# Patient Record
Sex: Male | Born: 1990 | Race: White | Hispanic: No | Marital: Single | Smoking: Current every day smoker
Health system: Southern US, Community
[De-identification: ages and names within clinical notes are randomized; demographics above are authoritative.]

---
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
## 2012-07-05 ENCOUNTER — Emergency Department (HOSPITAL_COMMUNITY)
Admission: EM | Admit: 2012-07-05 | Discharge: 2012-07-05 | Disposition: A | Discharge status: Home / Self Care | Payer: Self-pay | Attending: Emergency Medicine | Admitting: Emergency Medicine

## 2012-07-05 ENCOUNTER — Encounter (HOSPITAL_COMMUNITY): Payer: Self-pay | Admitting: Emergency Medicine

## 2012-07-05 DIAGNOSIS — R110 Nausea: Secondary | ICD-10-CM | POA: Insufficient documentation

## 2012-07-05 DIAGNOSIS — R109 Unspecified abdominal pain: Secondary | ICD-10-CM | POA: Insufficient documentation

## 2012-07-05 DIAGNOSIS — F17200 Nicotine dependence, unspecified, uncomplicated: Secondary | ICD-10-CM | POA: Insufficient documentation

## 2012-07-05 DIAGNOSIS — J45909 Unspecified asthma, uncomplicated: Secondary | ICD-10-CM | POA: Insufficient documentation

## 2012-07-05 LAB — CBC WITH DIFFERENTIAL/PLATELET
Basophils Relative: 0 % (ref 0–1)
Eosinophils Absolute: 0.4 10*3/uL (ref 0.0–0.7)
Hemoglobin: 15.2 g/dL (ref 13.0–17.0)
MCH: 30 pg (ref 26.0–34.0)
MCHC: 34.2 g/dL (ref 30.0–36.0)
Monocytes Relative: 11 % (ref 3–12)
Neutrophils Relative %: 68 % (ref 43–77)

## 2012-07-05 LAB — URINALYSIS, ROUTINE W REFLEX MICROSCOPIC
Bilirubin Urine: NEGATIVE
Glucose, UA: NEGATIVE mg/dL
Hgb urine dipstick: NEGATIVE
Ketones, ur: NEGATIVE mg/dL
Leukocytes, UA: NEGATIVE
Nitrite: NEGATIVE
Protein, ur: NEGATIVE mg/dL
Specific Gravity, Urine: 1.025 (ref 1.005–1.030)
Urobilinogen, UA: 0.2 mg/dL (ref 0.0–1.0)
pH: 6 (ref 5.0–8.0)

## 2012-07-05 LAB — LIPASE, BLOOD: Lipase: 10 U/L — ABNORMAL LOW (ref 11–59)

## 2012-07-05 LAB — COMPREHENSIVE METABOLIC PANEL
Albumin: 4 g/dL (ref 3.5–5.2)
Alkaline Phosphatase: 67 U/L (ref 39–117)
BUN: 8 mg/dL (ref 6–23)
Creatinine, Ser: 0.93 mg/dL (ref 0.50–1.35)
Potassium: 4.3 mEq/L (ref 3.5–5.1)
Total Protein: 7 g/dL (ref 6.0–8.3)

## 2012-07-05 MED ORDER — OXYCODONE-ACETAMINOPHEN 5-325 MG PO TABS
1.0000 | ORAL_TABLET | Freq: Once | ORAL | Status: AC
  Administered 2012-07-05: 1 via ORAL
  Filled 2012-07-05: qty 1

## 2012-07-05 MED ORDER — ONDANSETRON 8 MG PO TBDP
8.0000 mg | ORAL_TABLET | Freq: Once | ORAL | Status: AC
  Administered 2012-07-05: 8 mg via ORAL

## 2012-07-05 MED ORDER — ONDANSETRON 8 MG PO TBDP
ORAL_TABLET | ORAL | Status: AC
  Administered 2012-07-05: 8 mg via ORAL
  Filled 2012-07-05: qty 1

## 2012-07-05 MED ORDER — ONDANSETRON 8 MG PO TBDP: 8.0000 mg | ORAL_TABLET | Freq: Three times a day (TID) | ORAL | Status: DC | PRN

## 2012-07-05 NOTE — ED Notes (Signed)
Pt tolerating liquids, states he feels hungry.  States he still has some nausea and lower abdominal pain.  States he does feel okay to go home.  Follow up thoroughly discussed with patient and family who agree to follow up for new or worsening symptoms.

## 2012-07-05 NOTE — ED Notes (Signed)
Patient states now his nausea and pain are resolving.  States it is much better now than earlier today.

## 2012-07-05 NOTE — ED Notes (Signed)
States he started having right sided abdominal pain yesterday, states he had nausea but no vomiting.

## 2012-07-05 NOTE — ED Notes (Signed)
States that his pain began approx. 12 hours ago.  States initially it was 10/10, however now is 6/10.  States he did have some nausea around midnight that continues presently.

## 2012-07-05 NOTE — ED Provider Notes (Signed)
History     CSN: [id number]  Arrival date & time 07/05/12  0134   First MD Initiated Contact with Patient 07/05/12 0141      Chief Complaint  Patient presents with  . Abdominal Pain     Patient is a 21 y.o. male presenting with abdominal pain. The history is provided by the patient.  Abdominal Pain The primary symptoms of the illness include abdominal pain and nausea. The primary symptoms of the illness do not include vomiting, diarrhea or dysuria. The current episode started 1 to 2 hours ago. The onset of the illness was sudden. The problem has been rapidly improving.  Symptoms associated with the illness do not include chills.  PT reports he started to have abdominal pain while sleeping.  Reports it woke him up from sleep.  He reports it was right mid abdomen in location.   It does not radiate.  No dysuria or groin pain reported.  No back or flank pain.  He reports it is improving.  He reports he may have similar episode last night, but none before that.  He has no h/o kidney stones The patient told the nurse he has not had a BM in two days, but no vomiting is reported Past Medical History  Diagnosis Date  . Asthma     History reviewed. No pertinent past surgical history.  No family history on file.  History  Substance Use Topics  . Smoking status: Current Every Day Smoker -- 0.5 packs/day  . Smokeless tobacco: Current User    Types: Chew  . Alcohol Use: No      Review of Systems  Constitutional: Negative for chills.  Gastrointestinal: Positive for nausea and abdominal pain. Negative for vomiting and diarrhea.  Genitourinary: Negative for dysuria.  All other systems reviewed and are negative.    Allergies  Review of patient's allergies indicates no known allergies.  Home Medications  No current outpatient prescriptions on file.  BP 156/84  Pulse 106  Temp 97.7 F (36.5 C) (Oral)  Resp 20  Ht 5\' 10"  (1.778 m)  Wt 200 lb (90.719 kg)  BMI 28.70 kg/m2  SpO2  98% BP 135/84  Pulse 73  Temp 97.7 F (36.5 C) (Oral)  Resp 16  Ht 5\' 10"  (1.778 m)  Wt 200 lb (90.719 kg)  BMI 28.70 kg/m2  SpO2 98%   Physical Exam CONSTITUTIONAL: Well developed/well nourished HEAD AND FACE: Normocephalic/atraumatic EYES: EOMI/PERRL, no icterus ENMT: Mucous membranes moist NECK: supple no meningeal signs SPINE:entire spine nontender CV: S1/S2 noted, no murmurs/rubs/gallops noted LUNGS: Lungs are clear to auscultation bilaterally, no apparent distress ABDOMEN: soft, nontender, no rebound or guarding GU:no cva tenderness NEURO: Pt is awake/alert, moves all extremitiesx4 EXTREMITIES: pulses normal, full ROM SKIN: warm, color normal PSYCH: no abnormalities of mood noted  ED Course  Procedures    Labs Reviewed  URINALYSIS, ROUTINE W REFLEX MICROSCOPIC   2:06 AM Pt presents for abdominal pain.  He is in no distress.  He points to periumbical region as site of pain but there is no tenderness to palpation.  His exam is unremarkable.  Will check urine and reassess.  He does not want any pain meds 2:56 AM On re-exam, he reports he had an episode of pain that radiated from left side to right side of his abdomen.  He has no current abdominal tenderness.  He is watching TV in no distress.  His pain changes in location with each eval.  I offered labs and  monitoring, and pt agrees.  Do not feel he needs imaging at this time 3:45 AM Pt continues to watch TV.  He is in no distress.  He is taking PO fluids.  His abdomen is soft to palpation without any focal tenderness.  I doubt acute abdominal/urologic emergency/appendicitis given his exam.  I discussed at length with patient and family strict return precautions.  These were listed in his discharge paperwork.    MDM  Nursing notes including past medical history and social history reviewed and considered in documentation Labs/vital reviewed and considered        Joya Gaskins, MD 07/05/12 [phone]

## 2012-07-05 NOTE — ED Notes (Signed)
States his lower abdominal pain has returned along with nausea, MD notified.

## 2015-02-24 ENCOUNTER — Encounter (HOSPITAL_COMMUNITY): Payer: Self-pay

## 2015-02-24 ENCOUNTER — Emergency Department (HOSPITAL_COMMUNITY)
Admission: EM | Admit: 2015-02-24 | Discharge: 2015-02-24 | Disposition: A | Discharge status: Home / Self Care | Payer: Medicaid Other | Attending: Emergency Medicine | Admitting: Emergency Medicine

## 2015-02-24 DIAGNOSIS — J45909 Unspecified asthma, uncomplicated: Secondary | ICD-10-CM | POA: Insufficient documentation

## 2015-02-24 DIAGNOSIS — F10129 Alcohol abuse with intoxication, unspecified: Secondary | ICD-10-CM | POA: Insufficient documentation

## 2015-02-24 DIAGNOSIS — F10929 Alcohol use, unspecified with intoxication, unspecified: Secondary | ICD-10-CM

## 2015-02-24 DIAGNOSIS — Z720 Tobacco use: Secondary | ICD-10-CM | POA: Insufficient documentation

## 2015-02-24 LAB — CBC WITH DIFFERENTIAL/PLATELET
BASOS PCT: 0 % (ref 0–1)
Basophils Absolute: 0 10*3/uL (ref 0.0–0.1)
EOS ABS: 0.2 10*3/uL (ref 0.0–0.7)
Eosinophils Relative: 2 % (ref 0–5)
HCT: 44.5 % (ref 39.0–52.0)
HEMOGLOBIN: 15.3 g/dL (ref 13.0–17.0)
Lymphocytes Relative: 25 % (ref 12–46)
Lymphs Abs: 2.9 10*3/uL (ref 0.7–4.0)
MCH: 30.1 pg (ref 26.0–34.0)
MCHC: 34.4 g/dL (ref 30.0–36.0)
MCV: 87.6 fL (ref 78.0–100.0)
MONO ABS: 0.8 10*3/uL (ref 0.1–1.0)
Monocytes Relative: 7 % (ref 3–12)
NEUTROS ABS: 7.8 10*3/uL — AB (ref 1.7–7.7)
NEUTROS PCT: 66 % (ref 43–77)
PLATELETS: 257 10*3/uL (ref 150–400)
RBC: 5.08 MIL/uL (ref 4.22–5.81)
RDW: 12.2 % (ref 11.5–15.5)
WBC: 11.8 10*3/uL — AB (ref 4.0–10.5)

## 2015-02-24 LAB — RAPID URINE DRUG SCREEN, HOSP PERFORMED
Amphetamines: NOT DETECTED
BARBITURATES: NOT DETECTED
Benzodiazepines: NOT DETECTED
COCAINE: NOT DETECTED
OPIATES: NOT DETECTED
Tetrahydrocannabinol: NOT DETECTED

## 2015-02-24 LAB — URINALYSIS, ROUTINE W REFLEX MICROSCOPIC
BILIRUBIN URINE: NEGATIVE
GLUCOSE, UA: NEGATIVE mg/dL
HGB URINE DIPSTICK: NEGATIVE
KETONES UR: NEGATIVE mg/dL
Nitrite: NEGATIVE
PH: 5.5 (ref 5.0–8.0)
Protein, ur: NEGATIVE mg/dL
Specific Gravity, Urine: 1.01 (ref 1.005–1.030)
Urobilinogen, UA: 0.2 mg/dL (ref 0.0–1.0)

## 2015-02-24 LAB — URINE MICROSCOPIC-ADD ON

## 2015-02-24 LAB — COMPREHENSIVE METABOLIC PANEL
ALBUMIN: 4.4 g/dL (ref 3.5–5.0)
ALT: 14 U/L — ABNORMAL LOW (ref 17–63)
ANION GAP: 13 (ref 5–15)
AST: 18 U/L (ref 15–41)
Alkaline Phosphatase: 64 U/L (ref 38–126)
BUN: 8 mg/dL (ref 6–20)
CALCIUM: 8.7 mg/dL — AB (ref 8.9–10.3)
CHLORIDE: 108 mmol/L (ref 101–111)
CO2: 19 mmol/L — ABNORMAL LOW (ref 22–32)
Creatinine, Ser: 0.91 mg/dL (ref 0.61–1.24)
GFR calc Af Amer: >60 mL/min (ref >60)
GFR calc non Af Amer: >60 mL/min (ref >60)
GLUCOSE: 97 mg/dL (ref 65–99)
POTASSIUM: 3.1 mmol/L — AB (ref 3.5–5.1)
Sodium: 140 mmol/L (ref 135–145)
TOTAL PROTEIN: 7.4 g/dL (ref 6.5–8.1)
Total Bilirubin: 0.3 mg/dL (ref 0.3–1.2)

## 2015-02-24 LAB — ETHANOL: ALCOHOL ETHYL (B): 367 mg/dL — AB (ref <5)

## 2015-02-24 MED ORDER — ONDANSETRON HCL 4 MG/2ML IJ SOLN
4.0000 mg | Freq: Once | INTRAMUSCULAR | Status: AC
  Administered 2015-02-24: 4 mg via INTRAVENOUS
  Filled 2015-02-24: qty 2

## 2015-02-24 MED ORDER — SODIUM CHLORIDE 0.9 % IV BOLUS (SEPSIS)
1000.0000 mL | Freq: Once | INTRAVENOUS | Status: AC
  Administered 2015-02-24: 1000 mL via INTRAVENOUS

## 2015-02-24 NOTE — ED Notes (Signed)
Patient was ambulated with assistance to use portable urinal.  Patient has been loud and need constant redirection, mother was allow at bedside to calm down patient.

## 2015-02-24 NOTE — Discharge Instructions (Signed)
Alcohol Intoxication Alcohol intoxication occurs when you drink enough alcohol that it affects your ability to function. It can be mild or very severe. Drinking a lot of alcohol in a short time is called binge drinking. This can be very harmful. Drinking alcohol can also be more dangerous if you are taking medicines or other drugs. Some of the effects caused by alcohol may include:  Loss of coordination.  Changes in mood and behavior.  Unclear thinking.  Trouble talking (slurred speech).  Throwing up (vomiting).  Confusion.  Slowed breathing.  Twitching and shaking (seizures).  Loss of consciousness. HOME CARE  Do not drive after drinking alcohol.  Drink enough water and fluids to keep your pee (urine) clear or pale yellow. Avoid caffeine.  Only take medicine as told by your doctor. GET HELP IF:  You throw up (vomit) many times.  You do not feel better after a few days.  You frequently have alcohol intoxication. Your doctor can help decide if you should see a substance use treatment counselor. GET HELP RIGHT AWAY IF:  You become shaky when you stop drinking.  You have twitching and shaking.  You throw up blood. It may look bright red or like coffee grounds.  You notice blood in your poop (bowel movements).  You become lightheaded or pass out (faint). MAKE SURE YOU:   Understand these instructions.  Will watch your condition.  Will get help right away if you are not doing well or get worse. Document Released: 01/24/2008 Document Revised: 04/09/2013 Document Reviewed: 01/10/2013 [facility] Patient Information 2015 [employer], [place]. This information is not intended to replace advice given to you by your health care provider. Make sure you discuss any questions you have with your health care provider.   No alcohol. Increase fluids.

## 2015-02-24 NOTE — ED Notes (Signed)
Pt apparently vomited a large amount of em[facility] [place] while en route in the ambulance.  Pt had exposure labs drawn prior to leaving the e.d. [facility]

## 2015-02-24 NOTE — ED Provider Notes (Signed)
CSN: [id number]     Arrival date & time 02/24/15  1918 History   First MD Initiated Contact with Patient 02/24/15 1928     Chief Complaint  Patient presents with  . Alcohol Intoxication     (Consider location/radiation/quality/duration/timing/severity/associated sxs/prior Treatment) HPI..... Level V caveat for alcohol intoxication. Patient drank 2 quarts of moonshine this afternoon. His mother called EMS after he became more responsive. He is normally healthy. He does not normally drink excessively. He was stressed out secondary to some business problems. No chronic health problems. He vomited en route. Patient is yelling and generally uncooperative in the emergency department  Past Medical History  Diagnosis Date  . Asthma    History reviewed. No pertinent past surgical history. No family history on file. History  Substance Use Topics  . Smoking status: Current Every Day Smoker -- 0.50 packs/day  . Smokeless tobacco: Current User    Types: Chew  . Alcohol Use: Yes     Comment: Moonshine    Review of Systems  Unable to perform ROS: Other      Allergies  Review of patient's allergies indicates no known allergies.  Home Medications   Prior to Admission medications   Medication Sig Start Date End Date Taking? Authorizing Provider  ibuprofen (ADVIL,MOTRIN) 200 MG tablet Take 200 mg by mouth every 6 (six) hours as needed for mild pain or moderate pain.   Yes Historical Provider, MD   BP 126/114 mmHg  Pulse 95  Resp 16  SpO2 100% Physical Exam  Constitutional: He is oriented to person, place, and time.  Patient yelling and cursing  HENT:  Head: Normocephalic and atraumatic.  Eyes: Conjunctivae and EOM are normal. Pupils are equal, round, and reactive to light.  Neck: Normal range of motion. Neck supple.  Cardiovascular: Normal rate and regular rhythm.   Pulmonary/Chest: Effort normal and breath sounds normal.  Abdominal: Soft. Bowel sounds are normal.  Musculoskeletal:  Normal range of motion.  Neurological: He is alert and oriented to person, place, and time.  Skin: Skin is warm and dry.  Psychiatric: He has a normal mood and affect. His behavior is normal.  Nursing note and vitals reviewed.   ED Course  Procedures (including critical care time) Labs Review Labs Reviewed  ETHANOL - Abnormal; Notable for the following:    Alcohol, Ethyl (B) 367 (*)    All other components within normal limits  COMPREHENSIVE METABOLIC PANEL - Abnormal; Notable for the following:    Potassium 3.1 (*)    CO2 19 (*)    Calcium 8.7 (*)    ALT 14 (*)    All other components within normal limits  CBC WITH DIFFERENTIAL/PLATELET - Abnormal; Notable for the following:    WBC 11.8 (*)    Neutro Abs 7.8 (*)    All other components within normal limits  URINALYSIS, ROUTINE W REFLEX MICROSCOPIC ([facility]) - Abnormal; Notable for the following:    Leukocytes, UA TRACE (*)    All other components within normal limits  URINE RAPID DRUG SCREEN, HOSP PERFORMED  URINE MICROSCOPIC-ADD ON  RAPID HIV SCREEN (HIV 1/2 AB+AG)  HEPATITIS PANEL, ACUTE    Imaging Review No results found.   EKG Interpretation   Date/Time:  Wednesday February 24 2015 19:22:53 EDT Ventricular Rate:  88 PR Interval:  151 QRS Duration: 86 QT Interval:  346 QTC Calculation: 419 R Axis:   69 Text Interpretation:  Sinus rhythm ST elev, probable normal early repol  pattern  Confirmed by Adriana Simas  MD, Kathrynne Kulinski ([id number]) on 02/24/2015 7:31:44 PM     CRITICAL CARE Performed by: Donnetta Hutching Total critical care time: 30 Critical care time was exclusive of separately billable procedures and treating other patients. Critical care was necessary to treat or prevent imminent or life-threatening deterioration. Critical care was time spent personally by me on the following activities: development of treatment plan with patient and/or surrogate as well as nursing, discussions with consultants, evaluation of patient's  response to treatment, examination of patient, obtaining history from patient or surrogate, ordering and performing treatments and interventions, ordering and review of laboratory studies, ordering and review of radiographic studies, pulse oximetry and re-evaluation of patient's condition. MDM   Final diagnoses:  Alcohol intoxication, with unspecified complication    Patient rechecked multiple times. Normal airway. No aspiration. At approximately 2300 he was lucid and talking. No neurological deficits. Discussed with his mother. She will take him home.    Donnetta Hutching, MD 02/24/15 [phone]

## 2015-02-24 NOTE — ED Notes (Signed)
CRITICAL VALUE ALERT  Critical value received:  Ethanol-367  Date of notification:  02/24/15  Time of notification:  2108  Critical value read back:Yes.    Nurse who received alert:  Y. Daleen SquibbWall, RN  MD notified (1st page):  Dr. Adriana Simasook  Time of first page:  2108  Responding MD:  Dr. Adriana Simasook  Time MD responded:  2110

## 2015-02-24 NOTE — ED Notes (Signed)
Patient was discharged in custody of mother via wheelchair. Vital signs stable.

## 2015-02-24 NOTE — ED Notes (Signed)
Pt in by ems with etoh intoxication, reportedly drank 2 quarts of moonshine, has been vomiting en route and pt is uncooperative

## 2015-02-24 NOTE — ED Notes (Signed)
Patient currently sleeping unable to perform CIWA alcohol scale at this time.

## 2015-02-25 LAB — RAPID HIV SCREEN (HIV 1/2 AB+AG)
HIV 1/2 Antibodies: NONREACTIVE
HIV-1 P24 Antigen - HIV24: NONREACTIVE

## 2015-02-26 LAB — HEPATITIS PANEL, ACUTE
HCV Ab: <0.1 s/co ratio (ref 0.0–0.9)
HEP B C IGM: NEGATIVE
Hep A IgM: NEGATIVE
Hepatitis B Surface Ag: NEGATIVE

## 2017-01-08 ENCOUNTER — Emergency Department (HOSPITAL_COMMUNITY)
Admission: EM | Admit: 2017-01-08 | Discharge: 2017-01-08 | Disposition: A | Discharge status: Home / Self Care | Payer: Self-pay | Attending: Emergency Medicine | Admitting: Emergency Medicine

## 2017-01-08 ENCOUNTER — Encounter (HOSPITAL_COMMUNITY): Payer: Self-pay | Admitting: Emergency Medicine

## 2017-01-08 ENCOUNTER — Emergency Department (HOSPITAL_COMMUNITY): Payer: Self-pay

## 2017-01-08 DIAGNOSIS — Z8709 Personal history of other diseases of the respiratory system: Secondary | ICD-10-CM | POA: Insufficient documentation

## 2017-01-08 DIAGNOSIS — J40 Bronchitis, not specified as acute or chronic: Secondary | ICD-10-CM | POA: Insufficient documentation

## 2017-01-08 DIAGNOSIS — F17220 Nicotine dependence, chewing tobacco, uncomplicated: Secondary | ICD-10-CM | POA: Insufficient documentation

## 2017-01-08 DIAGNOSIS — R079 Chest pain, unspecified: Secondary | ICD-10-CM

## 2017-01-08 DIAGNOSIS — Z79899 Other long term (current) drug therapy: Secondary | ICD-10-CM | POA: Insufficient documentation

## 2017-01-08 LAB — BASIC METABOLIC PANEL
ANION GAP: 9 (ref 5–15)
BUN: 8 mg/dL (ref 6–20)
CO2: 26 mmol/L (ref 22–32)
Calcium: 9.8 mg/dL (ref 8.9–10.3)
Chloride: 105 mmol/L (ref 101–111)
Creatinine, Ser: 0.98 mg/dL (ref 0.61–1.24)
GFR calc Af Amer: >60 mL/min (ref >60)
GFR calc non Af Amer: >60 mL/min (ref >60)
GLUCOSE: 88 mg/dL (ref 65–99)
POTASSIUM: 4.3 mmol/L (ref 3.5–5.1)
Sodium: 140 mmol/L (ref 135–145)

## 2017-01-08 LAB — CBC
HEMATOCRIT: 47.5 % (ref 39.0–52.0)
Hemoglobin: 16.2 g/dL (ref 13.0–17.0)
MCH: 30.3 pg (ref 26.0–34.0)
MCHC: 34.1 g/dL (ref 30.0–36.0)
MCV: 89 fL (ref 78.0–100.0)
Platelets: 293 10*3/uL (ref 150–400)
RBC: 5.34 MIL/uL (ref 4.22–5.81)
RDW: 12.7 % (ref 11.5–15.5)
WBC: 8.8 10*3/uL (ref 4.0–10.5)

## 2017-01-08 LAB — TROPONIN I
Troponin I: <0.03 ng/mL (ref <0.03)
Troponin I: <0.03 ng/mL (ref <0.03)

## 2017-01-08 MED ORDER — PREDNISONE 20 MG PO TABS: ORAL_TABLET | ORAL | 0 refills | Status: AC

## 2017-01-08 MED ORDER — IPRATROPIUM-ALBUTEROL 0.5-2.5 (3) MG/3ML IN SOLN
3.0000 mL | Freq: Once | RESPIRATORY_TRACT | Status: AC
  Administered 2017-01-08: 3 mL via RESPIRATORY_TRACT
  Filled 2017-01-08: qty 3

## 2017-01-08 MED ORDER — ALBUTEROL SULFATE HFA 108 (90 BASE) MCG/ACT IN AERS
4.0000 | INHALATION_SPRAY | Freq: Once | RESPIRATORY_TRACT | Status: AC
  Administered 2017-01-08: 4 via RESPIRATORY_TRACT
  Filled 2017-01-08: qty 6.7

## 2017-01-08 MED ORDER — PREDNISONE 50 MG PO TABS
60.0000 mg | ORAL_TABLET | Freq: Once | ORAL | Status: AC
  Administered 2017-01-08: 60 mg via ORAL
  Filled 2017-01-08: qty 1

## 2017-01-08 NOTE — ED Provider Notes (Signed)
[facility]-EMER[facility]ENCY DEPT Provider Note   CSN: [id number] Arrival date & time: 01/08/17  1203     History   Chief Complaint Chief Complaint  Patient presents with  . Chest Pain    HPI Grant Parsons is a 26 y.o. male.  HPI  26 year old male that presents to the emergency department today with atypical sounding chest pain for the last 2 months. He states that he has it intermittently without provocation. Associated with a little shortness of breath and a pleuritic sharp chest pain. No fevers but does have some cough with that. He smokes has a history of cocaine use also a history of marijuana use. States not related to exertion nor is associated nausea or diaphoresis. He says he had the pain earlier today and it was associated with some shortness of breath so he came here for further evaluation. He has since quit drinking and doing drugs but still smokes cigarettes for which he is trying to quit. No recent travels, surgeries or other risk factors for pulmonary embolus.   Past Medical History:  Diagnosis Date  . Asthma     There are no active problems to display for this patient.   History reviewed. No pertinent surgical history.     Home Medications    Prior to Admission medications   Medication Sig Start Date End Date Taking? Authorizing Provider  acetaminophen (TYLENOL) 500 MG tablet Take 1,000 mg by mouth every 6 (six) hours as needed.   Yes [provider]  ibuprofen (ADVIL,MOTRIN) 200 MG tablet Take 200 mg by mouth every 6 (six) hours as needed for mild pain or moderate pain.   Yes [provider]  predniSONE (DELTASONE) 20 MG tablet 2 tabs po daily x 4 days 01/08/17   Maxamus Colao, Barbara Cower, MD    Family History History reviewed. No pertinent family history.  Social History Social History  Substance Use Topics  . Smoking status: Current Every Day Smoker    Packs/day: 0.50  . Smokeless tobacco: Current User    Types: Chew  . Alcohol use Yes   Comment: "once a week"     Allergies   Patient has no known allergies.   Review of Systems Review of Systems  All other systems reviewed and are negative.    Physical Exam Updated Vital Signs BP (!) 150/77   Pulse 61   Temp 98.1 F (36.7 C) (Temporal)   Resp 18   Ht 5\' 10"  (1.778 m)   Wt 83.9 kg (185 lb)   SpO2 96%   BMI 26.54 kg/m   Physical Exam  Constitutional: He is oriented to person, place, and time. He appears well-developed and well-nourished.  HENT:  Head: Normocephalic and atraumatic.  Eyes: Conjunctivae and EOM are normal.  Neck: Normal range of motion.  Cardiovascular: Normal rate.   Pulmonary/Chest: Effort normal. No respiratory distress. He has decreased breath sounds. He has wheezes.  Abdominal: Soft. He exhibits no distension.  Musculoskeletal: Normal range of motion.  Neurological: He is alert and oriented to person, place, and time. No cranial nerve deficit. Coordination normal.  Skin: Skin is warm and dry.  Nursing note and vitals reviewed.    ED Treatments / Results  Labs (all labs ordered are listed, but only abnormal results are displayed) Labs Reviewed  BASIC METABOLIC PANEL  CBC  TROPONIN I  TROPONIN I    EKG  EKG Interpretation  Date/Time:  Monday Jan 08 2017 12:10:08 EDT Ventricular Rate:  78 PR Interval:  108  QRS Duration: 88 QT Interval:  362 QTC Calculation: 412 R Axis:   62 Text Interpretation:  Sinus rhythm with short PR Otherwise normal ECG No significant change since last tracing Confirmed by Marily MemosMesner, Oswin Griffith [phone]([id number]) on 01/08/2017 3:13:29 PM       Radiology Dg Chest 2 View  Result Date: 01/08/2017 CLINICAL DATA:  Left-sided chest pain worse for the past 2 months. EXAM: CHEST  2 VIEW COMPARISON:  11/04/2010 FINDINGS: The heart size and mediastinal contours are within normal limits. Both lungs are clear. The visualized skeletal structures are unremarkable. IMPRESSION: No active cardiopulmonary disease. Electronically  Signed   By: Elige KoHetal  Patel   On: 01/08/2017 12:41    Procedures Procedures (including critical care time)  Medications Ordered in ED Medications  albuterol (PROVENTIL HFA;VENTOLIN HFA) 108 (90 Base) MCG/ACT inhaler 4 puff (not administered)  ipratropium-albuterol (DUONEB) 0.5-2.5 (3) MG/3ML nebulizer solution 3 mL (3 mLs Nebulization Given 01/08/17 1545)  predniSONE (DELTASONE) tablet 60 mg (60 mg Oral Given 01/08/17 1605)     Initial Impression / Assessment and Plan / ED Course  I have reviewed the triage vital signs and the nursing notes.  Pertinent labs & imaging results that were available during my care of the patient were reviewed by me and considered in my medical decision making (see chart for details).     Delta troponins negative, EKG negative, chest x-ray normal. PERC negative. Suspect likely bronchitis as a cause for his symptoms. His discomfort and his breathing improved dramatically after DuoNeb's steroids. We'll continue the same. Patient needs PCP follow-up for multiple other complaints that he has and help him quit smoking. Information provided for the same.   Final Clinical Impressions(s) / ED Diagnoses   Final diagnoses:  Bronchitis  Nonspecific chest pain    New Prescriptions New Prescriptions   PREDNISONE (DELTASONE) 20 MG TABLET    2 tabs po daily x 4 days     Marily MemosMesner, Jaunice Mirza, MD 01/08/17 1739

## 2017-01-08 NOTE — ED Notes (Signed)
ekg given to dr wentz 

## 2017-01-08 NOTE — ED Notes (Signed)
Patient discharged in no acute distress. Lung fields clear, no wheezing or SOB noted. Patient reports feeling better since neb treatment. Discharge instructions reviewed and patient given opportunity to ask questions.

## 2017-01-08 NOTE — ED Notes (Signed)
Report given Anette RiedelAnn Hunt

## 2017-01-08 NOTE — ED Triage Notes (Signed)
Patient complaining of left sided chest pain since yesterday with shortness of breath. States "this has been going on for months and I finally need to get it checked out."

## 2019-02-23 IMAGING — CR DG CHEST 2V
2 series · 2 of 2 positions shown · non-contrast
Comparison: 11/04/2010

CLINICAL DATA: Left-sided chest pain worse for the past 2 months.

EXAM:
CHEST  2 VIEW

[w chest pa]
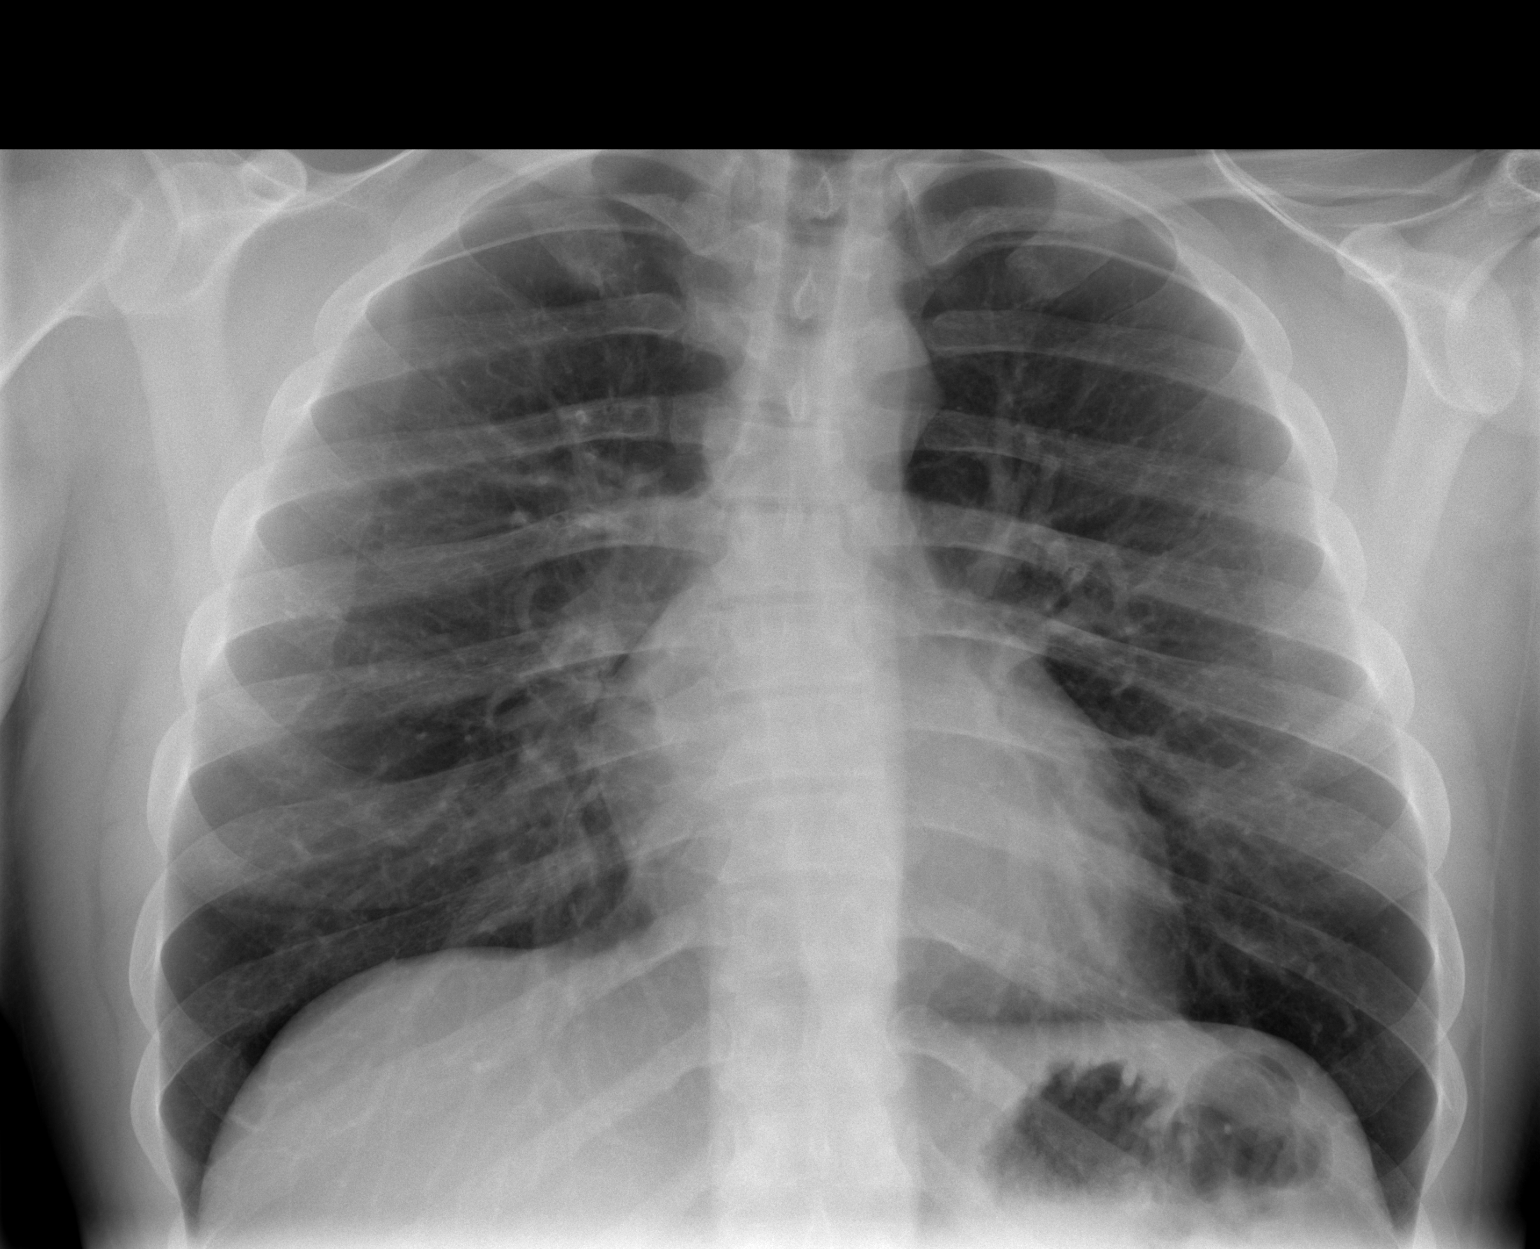

[w chest lat]
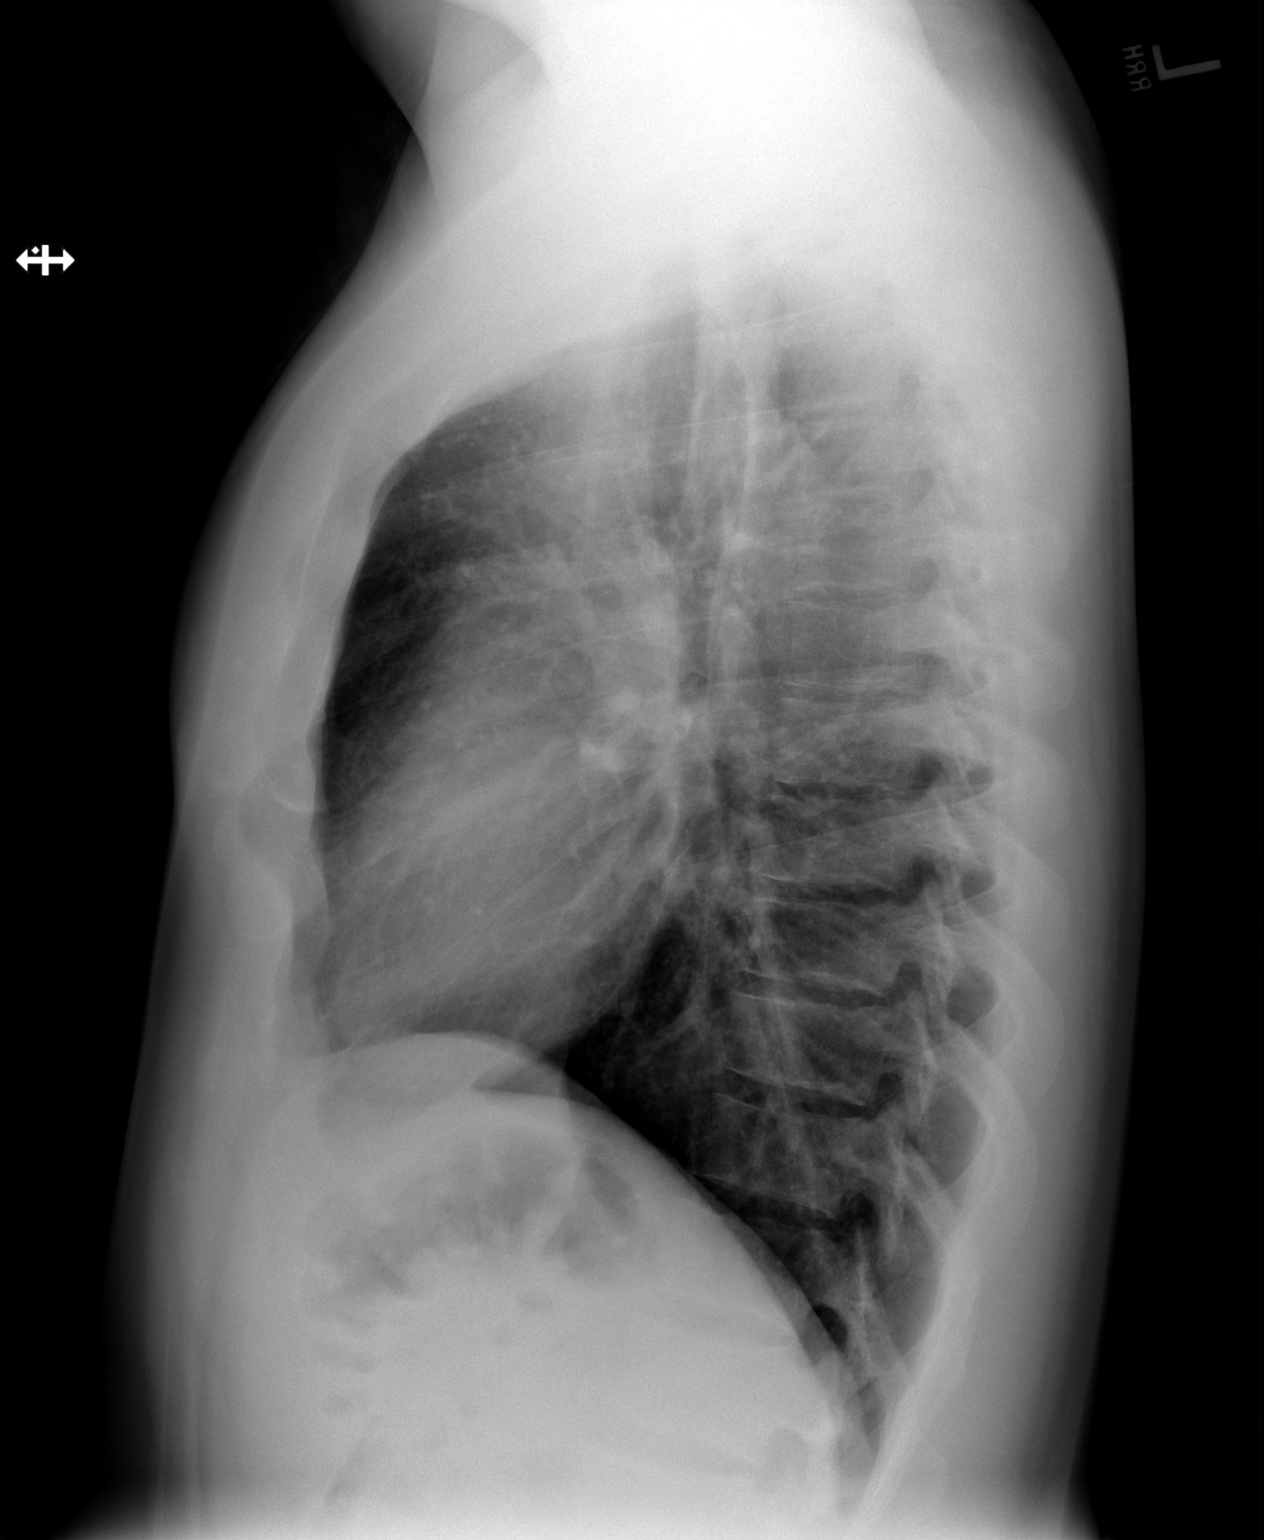

[2 of 2 positions shown; findings below may reference images not displayed]

FINDINGS: The heart size and mediastinal contours are within normal limits.
Both lungs are clear. The visualized skeletal structures are
unremarkable.
IMPRESSION: No active cardiopulmonary disease.

## 2023-03-11 NOTE — Care Plan (Signed)
  Problem: Suicide Risk Goal: Absence of Self-Harm Outcome: Resolved Intervention: Assess Risk to Self and Maintain Safety Recent Flowsheet Documentation Taken 03/11/2023 1005 by Shona Flint D, RN Behavior Management: behavioral plan reviewed   Problem: Psychotic Signs/Symptoms Goal: Improved Behavioral Control (Psychotic Signs/Symptoms) Outcome: Resolved Flowsheets (Taken 03/11/2023 1704) Mutually Determined Action Steps (Improved Behavioral Control): identifies symptoms triggers Goal: Optimal Cognitive Function (Psychotic Signs/Symptoms) Outcome: Resolved Goal: Increased Participation and Engagement (Psychotic Signs/Symptoms) Outcome: Resolved Goal: Improved Mood Symptoms (Psychotic Signs/Symptoms) Outcome: Resolved Goal: Improved Psychomotor Symptoms (Psychotic Signs/Symptoms) Outcome: Resolved Goal: Decreased Sensory Symptoms (Psychotic Signs/Symptoms) Outcome: Resolved Goal: Improved Sleep (Psychotic Signs/Symptoms) Outcome: Resolved Goal: Enhanced Social, Occupational or Functional Skills (Psychotic Signs/Symptoms) Outcome: Resolved   Problem: Adult Behavioral Health Plan of Care Goal: Plan of Care Review Outcome: Resolved Goal: Patient-Specific Goal (Individualization) 03/11/2023 1704 by Shona Flint BIRCH, RN Outcome: Resolved 03/11/2023 1158 by Shona Flint BIRCH, RN Flowsheets (Taken 03/11/2023 1158) Patient/Family-Specific Goals (Include Timeframe): Maintain compliancy with current treatment path Patient Personal Strengths: . expressive of emotions . expressive of needs . motivated for treatment . positive attitude Individualized Care Needs: Utilize positive coping skills and community resources Anxieties, Fears or Concerns: Denies Patient Vulnerabilities: family/relationship conflict Goal: Adheres to Safety Considerations for Self and Others Outcome: Resolved Goal: Absence of New-Onset Illness or Injury Outcome: Resolved Goal: Optimized Coping Skills in Response to  Life Stressors Outcome: Resolved Goal: Develops/Participates in Therapeutic Alliance to Support Successful Transition Outcome: Resolved Goal: Rounds/Family Conference Outcome: Resolved

## 2023-03-11 NOTE — Discharge Summary (Signed)
 [facility]  Discharge Summary Patient Name: Grant Parsons   Date of Birth: 1990/11/05   MRN: [id number]   Admit date and time: 03/09/2023  9:25 PM Discharge date and time: March 11, 2023 4:36 PM  Discharge Attending Physician: Camellia ONEIDA Lek, MD Discharge Unit 24-7 Contact Number: [phone]   Allergies: Patient has no known allergies.  Chief Concern/Admitting Diagnosis:  SI, psychosis   Discharge Diagnosis:  Principal Problem:   Major depressive disorder (POA: Unknown) Active Problems:   Methamphetamine abuse (CMS-HCC) (POA: Unknown) Resolved Problems:   Moderate episode of recurrent major depressive disorder (CMS-HCC) (POA: Unknown)   DISCHARGE INSTRUCTIONS:. Do not use drugs of any kind Engage in outpatient therapy   DISCHARGE DISPOSITION:.home with sister. Out patient referral made. No meds.  .   Day of Discharge Subjective:. Patient seen and examined. Case discussed with treatment team.  After our session this  morning he looked great and convincingly without any SI/Hi and no evidence of psychsosis. Family visit went great. Sister reports he looks at baseline. He agrees to return to live with her (and his niece and nephew who he loves and talked about repeatedly) at least temporarily. Will discharge today as there is little to no benefit from another night in hospital given his complete spontaneous resolution of psychosis and absence of SI  Day of Discharge Mental Status Exam:.The patient is awake alert and oriented in all spheres. Casually dressed and adequately groomed.  Speech is normal in rate and rhythm and tone.  Eye contact is good.  Motor exam is normal.  Mood and affect are euthymic.  Thought processes are logical.  Thought content is forward thinking and future oriented.  Convincingly without suicidal ideation.  No homicidal ideation.  Denies and does not evidence any gross psychotic symptoms.  Insight and judgment fair.   Hospital  Course:      CHIEF COMPLAINT:.  I do not know.  Her mother and sister said I was crazy.  HISTORY of PRESENT ILLNESS:.  Grant Parsons [birth year] is a 32 year old male who was admitted to the hospital under petition and involuntary committment due to paranoid, erratic potentially self endangering and aggressive behavior associated with methamphetamine use.  On exam he is more rational, has improved insight into his brief psychotic symptoms, is convincingly without suicidal and homicidal ideation, and agrees to voluntary hospitalization to further evaluate his symptoms and stabilize.  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  Review of consult from sending hospital and accompanying collateral from sister and mother support paranoid and psychotic symptoms with agitation and potential for self endangerment.  But that was in the context of his active methamphetamine intoxication complicated by some rather severe family/psychosocial stressors.  On exam he is calm cooperative not acutely psychotic, though still questions some of his original paranoid thoughts, and convincingly not suicidal or homicidal.  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  Admits to using it about twice a week and sleeping minimally for a day or 2 when he takes it.  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  Has not had a serious healthy relationship in years and perhaps really ever.  Felt abandoned when his mother essentially moved out of the house about 6 months ago with her new boyfriend.  Has been increasingly  lonely.  Came attracted to a person that he met incidentally and then began following them on Tik[employer] and [employer].  Became a bit obsessed with them believing that they might secretly harbor  romantic feelings for him and began to pursue them through social media and  telephone.  This individual apparently wants nothing to do with him and has actually contacted the patient's family and the police to state explicitly that the patient is not welcome to contact him and that he has a firearm and would protect himself if the patient came to his home.  The family petitioned because they were concerned the patient, and his paranoid and delusional state, would go to the home anyways and potentially be harmed.  The patient also allegedly threatened suicide and threatening aggression towards the family when they tried to stop them.  Again he admits to being hopeful that that person would have romantic feelings for him.  In believes that he was getting messages through tarot cards and divine intervention and twin flames etc. that supported all of that.  As we talked today however he is openly questioning all of that and seems to be open to the possibility that a combination of stress and amphetamine abuse has led him to over read and this perceive signals.  All that said, he was clearly paranoid and psychotic in the days leading up to the hospitalization but does not appear that way today.  He does endorse some feelings of depression.  Admits to recently feeling suicidal centered around feeling that his family was against him) but would never do it because of his niece and nephew.  Feels lonely and isolated.  Somewhat anhedonic and amotivated although he does still engage in some hobbies such as polishing and restoring old items such as boats etc.  He readily acknowledges that he needs to stop using methamphetamine and marijuana.  Also professes intent to follow-up as recommended with outpatient counseling and begin to engage in some more healthy prosocial behaviors.  Much of this actually centered around the fact that he identifies as gay and his family never knew that.  He was afraid to come out of the closet and openly date a gay person.  He believes that this gentleman  named Massie was also secretly gay and had not come out and believed that he was seeing signals or messages from [place] that the 2 of them could be a couple.  He is agreeable also to try to call his sister, Vernell Corrente at [phone] or his mother Landry Pepper at [phone] talk with him now that he is thinking more clearly.  At this point I do not see any clear indication for medications as he does not appear overtly psychotic.  However given the seriousness of his recent condition and its potential for intentional or accidental self injury and aggression, it is appropriate to observe him for couple days on the unit.  But given his current clarity of thought and cooperation and allowing him to sign in as a volunteer  MENTAL STATUS EXAM:.  Awake alert oriented in all spheres.  Clean dressed in hospital close.  Speech clear and understandable.  Eye contact good.  Motor exam normal.  Attitude initially constricted and guarded but then more open and engaging.  Mood and affect somewhat depressed.  Openly tearful at times.  Thought processes logical.  Thought content appropriate to questions.  Clearly evidencing some degree of paranoid ideas of reference some of which are already fading.  Admits  to recent passive thoughts of suicide but convincingly denies any plan or intent or current suicidal thoughts.  Adamantly denies any genuine intent to harm his mother or anyone else.  No hallucinations.  Insight and judgment improved  PAST PSYCHIATRIC HISTORY:.  No past psychiatric history of any kind.  No suicide attempts or medications  PAST MEDICAL HISTORY:. None Allergies-.  None   HOME MEDICATIONS:. None Compliance-.   SUBSTANCE ABUSE:.  Heavy alcohol and cocaine use a couple years ago for about a couple years.  Drinks rarely now and has not drank in a month.  Using methamphetamine averaging twice a week for about the last year.  Likely more heavily in the recent months.  Also uses marijuana  occasionally.  No other drugs.  Smokes cigarettes and dips   FAMILY PSYCHIATRIC HISTORY:.  No suicides.  He reports that his mother has a history of heavy marijuana use and frequently talks about seeing demons etc.  SOCIAL DEVELOPMENTAL HISTORY:.  Denies childhood or other trauma.  But had a difficult childhood.  Father never had much to do with him.  Very little to do with his father since his mother and father separated 10 or so years ago.  Had not seen his father in about 5 or 6 years but had recently reached out to him in the last week or 2.  No history of longstanding positive relationships.  Works as a naval architect.  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  Denies legal charges or history.  Denies firearms  IMPRESSION:.  32 year old male with no prior psychiatric history presents with brief psychotic symptoms which are already spontaneously resolving that are in the context primarily of his methamphetamine use but with significant family/psychosocial/psychodynamic stressors contributing.  Endorses symptoms of depression as well.   BEHAVIORAL HEALTH DIAGNOSES:. 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 No diagnoses   INITIAL PLAN at ADMISSION: March 10, 2023 [place]:. - Break petition and allowed to sign in as a volunteer - No medications - Observe and monitor for sustained resolution of psychosis and absence of suicidal ideations - Suggest he contact  his sister, Vernell Corrente at [phone] or his mother Landry Pepper at [phone] -Recommend outpatient follow-up with individual therapy addressing depressive symptoms and monitoring for sustained absence of any psychotic symptoms - Obviously needs to stop methamphetamine and marijuana use completely - Recommend engaging in some healthy prosocial  activities such as church . March 11, 2023 Camellia ONEIDA Lek, MD . - Appears to have essentially complete and spontaneous resolution of his paranoid symptoms without any medications - Convincingly denies suicidal ideation - Uncertain to what extent his isolation is really from a primary depression and how much his wrapped up in the fact that he had never come out with his sexual preference to family or friends - He does not desire medications and at this point I think that sobriety and individual therapy may be adequate - Expecting discharge tomorrow.  If family session goes quite well today, could even consider discharge today.  Would like to get collateral from family after their visit.   Discharge Medications:    Your Medication List    You have not been prescribed any medications.      Discharge Instructions:  Activity Instructions     Activity as tolerated        Other Instructions     Call  MD for:     Suicidal ideation.   Discharge instructions     HOSPITAL TRANSITION INFORMATION*: Reason for admission psychiatric stabilization.  Discharge Diagnosis:  Principal Problem:   Major depressive disorder Active Problems:   Methamphetamine abuse (CMS-HCC)   Test Results: Data Review: Results for orders placed or performed during the hospital encounter of 03/09/23 -COVID PCR:  Specimen: Nasopharyngeal Swab      Result                                 Value                   Ref Range                SARS-CoV-2 PCR                         Negative                Negative            -Comprehensive Metabolic Panel:       Result                                 Value                   Ref Range                Sodium                                 142                     135 - 145 mmol/L         Potassium                              3.7                     3.5 - 5.0 mmol/L         Chloride                               106                     98 - 107 mmol/L          CO2                                     29.2                    21.0 - 32.0 mmol/L       Anion Gap                              7                       3 - 11 mmol/L  BUN                                    6 (L)                   8 - 20 mg/dL             Creatinine                             0.76 (L)                0.80 - 1.30 mg/dL        BUN/Creatinine Ratio                   8                                                eGFR CKD-EPI (2021) Male               >90                     >=60 mL/min/1.53m2       Glucose                                109                     70 - 179 mg/dL           Calcium                                8.9                     8.5 - 10.1 mg/dL         Albumin                                3.4 (L)                 3.5 - 5.0 g/dL           Total Protein                          6.4                     6.0 - 8.0 g/dL           Total Bilirubin                        0.2 (L)                 0.3 - 1.2 mg/dL          AST                                    10 (L)  15 - 40 U/L              ALT                                    12                      12 - 78 U/L              Alkaline Phosphatase                   89                      46 - 116 U/L        -Ethanol:       Result                                 Value                   Ref Range                Alcohol, Ethyl                         <3                      <=10 mg/dL          -Urinalysis with Microscopy:       Result                                 Value                   Ref Range                Color, UA                              Yellow                                           Clarity, UA                            Clear                   Clear                    Specific Gravity, UA                   1.025                   1.010 - 1.025            pH, UA                                 6.0  5.0 - 8.0                Leukocyte Esterase, UA                 Negative                 Negative                 Nitrite, UA                            Negative                Negative                 Protein, UA                            Negative                Negative                 Glucose, UA                            Negative                Negative                 Ketones, UA                            Negative                Negative                 Urobilinogen, UA                       1.0 mg/dL               0.1 - 1.0 mg/dL          Bilirubin, UA                          Negative                Negative                 Blood, UA                              Large (A)               Negative                 RBC, UA                                10 (H)                  0 - 5 /HPF               WBC, UA                                1                       <=  5 /HPF                 Squam Epithel, UA                      0                       0 - 10 /HPF              Bacteria, UA                           None Seen               Few, Occasional, S* -Drug Screen, Urine:       Result                                 Value                   Ref Range                Amphetamines Screen, Ur                Positive (A)            Negative                 Barbiturates Screen, Ur                Negative                Negative                 Benzodiazepines Screen, Urine          Negative                Negative                 Cannabinoids Screen, Ur                Positive (A)            Negative                 Cocaine(Metab.)Screen, Urine           Negative                Negative                 Methadone Screen, Urine                Negative                Negative                 Opiates Screen, Ur                     Negative                Negative                 Oxycodone  Screen, Ur                   Negative                Negative            -  Acetaminophen  level:       Result                                 Value                   Ref Range                 Acetaminophen  Level                    <2.0 (L)                10.0 - 20.0 ug/ml   -Salicylate level:       Result                                 Value                   Ref Range                Salicylate Lvl                         <2.8                    <=30.0 mg/dL        -TSH:       Result                                 Value                   Ref Range                TSH                                    2.984                   0.550 - 4.780 uIU/* -T4, Free:       Result                                 Value                   Ref Range                Free T4                                1.02                    0.89 - 1.76 ng/dL   -Hemoglobin J8r:       Result                                 Value                   Ref Range  Hemoglobin A1C                         5.3                     4.8 - 5.6 %              Estimated Average Glucose              105                     mg/dL               -CBC w/ Differential:       Result                                 Value                   Ref Range                WBC                                    9.0                     4.0 - 10.5 10*9/L        RBC                                    4.83                    4.10 - 5.60 10*12/L      HGB                                    14.3                    12.5 - 17.0 g/dL         HCT                                    43.2                    36.0 - 50.0 %            MCV                                    89.4                    80.0 - 98.0 fL           MCH                                    29.6                    27.0 - 34.0 pg  MCHC                                   33.1                    32.0 - 36.0 g/dL         RDW                                    12.3                    11.5 - 14.5 %            MPV                                    9.3                     7.4 - 10.4 fL            Platelet                               266                     140 - 415  10*9/L         Neutrophils %                          47.5                    %                        Lymphocytes %                          40.1                    %                        Monocytes %                            7.4                     %                        Eosinophils %                          4.1                     %                        Basophils %                            0.6                     %  Absolute Neutrophils                   4.3                     1.8 - 7.8 10*9/L         Absolute Lymphocytes                   3.6                     0.7 - 4.5 10*9/L         Absolute Monocytes                     0.7                     0.1 - 1.0 10*9/L         Absolute Eosinophils                   0.4                     0.0 - 0.4 10*9/L         Absolute Basophils                     0.1                     0.0 - 0.2 10*9/L    Imaging: No image results found.   No results found for this or any previous visit (from the past 4464 hour(s)).   Pending Test Results: Call [phone] for any pending lab results.  Advanced Directives: Does patient have an advance directive covering medical treatment?: Patient does not have advance directive covering medical treatment. Reason patient does not have an advance directive covering medical treatment:: Patient does not wish to complete one at this time.  Healthcare Decision Maker: Patient does not wish to appoint a Health Care Decision Maker at this time Extended Emergency Contact Information Primary Emergency Contact: Carter,Thomas Home Phone: [phone] Relation: Grandparent Interpreter needed? No Information offered on HCDM, Medical & Mental Health advance directives:: Patient declined information.  Does patient have an advance directive covering mental health treatment?: Patient does not have advance directive covering mental health treatment. Reason patient does not have an advance directive  covering mental health treatment:: Patient does not wish to complete one at this time.  Discharge Unit 24-7 Contact Number: [phone]  DISCHARGE INSTRUCTIONS: -Please continue taking medications as prescribed.  -Please refrain from using illicit substances, as these can affect your mood and could cause anxiety or other concerning symptoms.  -Your outpatient provider will monitor any necessary lab work depending on your medications  -As a condition of discharge, you agree to follow-up with appropriate outpatient psychiatric providers. You have been provided prescriptions for a limited supply of your medications. All refills will need to be handled by your outpatient provider. -Do not make changes to your medications, including taking more or less than prescribed, unless under the supervision of your physician. Be aware that some medications may make you feel worse if abruptly stopped. -Seek further medical care for any increase in symptoms or new symptoms, such as thoughts of wanting to hurt yourself, hurt others, or if you have increased agitation.  Suicide Prevention Resources: National Suicide Prevention Lifeline - after March 05, 2021, dial the 3 digit code 58.  Prior to March 05, 2021, call 1-800-273-TALK 9091288372) Spanish/Espaol: (865)480-3319  Crisis Text Line Text HOME to 779-490-5067  Suicide Prevention Resource Center arizonafirm.com.ee  National Institutes of Health http://www.maynard.net/  Substance Abuse and Mental Health Services Administration skateoasis.com.pt   -For immediate concerns, call 911 for emergency medical attention. -Emergency services are available 24 hours a day at [facility] to get assistance in deciding appropriate care during periods of psychiatric concern. -Consider a psychiatric advanced directive at http://nrc-pad.org/   Discharge instructions     DO NOT USE DRUGS OR ANY KIND Follow up with outpatient therapy/counseling     Follow Up instructions and Outpatient Referrals    Call  MD for:     Discharge instructions     Discharge instructions           Patient was seen and examined. Case discussed with treatment team. Plan of care and recommendations discussed with the undersigned.  I spent less than 30 minutes in the discharge of this patient.   Camellia ONEIDA Lek, MD  Test Results: Data Review:  Results for orders placed or performed during the hospital encounter of 03/09/23  COVID PCR   Specimen: Nasopharyngeal Swab  Result Value Ref Range   SARS-CoV-2 PCR Negative Negative  Comprehensive Metabolic Panel  Result Value Ref Range   Sodium 142 135 - 145 mmol/L   Potassium 3.7 3.5 - 5.0 mmol/L   Chloride 106 98 - 107 mmol/L   CO2 29.2 21.0 - 32.0 mmol/L   Anion Gap 7 3 - 11 mmol/L   BUN 6 (L) 8 - 20 mg/dL   Creatinine 9.23 (L) 9.19 - 1.30 mg/dL   BUN/Creatinine Ratio 8    eGFR CKD-EPI (2021) Male >90 >=60 mL/min/1.11m2   Glucose 109 70 - 179 mg/dL   Calcium 8.9 8.5 - 89.8 mg/dL   Albumin 3.4 (L) 3.5 - 5.0 g/dL   Total Protein 6.4 6.0 - 8.0 g/dL   Total Bilirubin 0.2 (L) 0.3 - 1.2 mg/dL   AST 10 (L) 15 - 40 U/L   ALT 12 12 - 78 U/L   Alkaline Phosphatase 89 46 - 116 U/L  Ethanol  Result Value Ref Range   Alcohol, Ethyl <3 <=10 mg/dL  Urinalysis with Microscopy  Result Value Ref Range   Color, UA Yellow    Clarity, UA Clear Clear   Specific Gravity, UA 1.025 1.010 - 1.025   pH, UA 6.0 5.0 - 8.0   Leukocyte Esterase, UA Negative Negative   Nitrite, UA Negative Negative   Protein, UA Negative Negative   Glucose, UA Negative Negative   Ketones, UA Negative Negative   Urobilinogen, UA 1.0 mg/dL 0.1 - 1.0 mg/dL   Bilirubin, UA Negative Negative   Blood, UA Large (A) Negative   RBC, UA 10 (H) 0 - 5 /HPF   WBC, UA 1 <=5 /HPF   Squam Epithel, UA 0 0 - 10 /HPF   Bacteria, UA None Seen Few, Occasional, Small, None Seen /HPF  Drug Screen, Urine  Result Value Ref Range   Amphetamines Screen, Ur Positive (A) Negative   Barbiturates Screen, Ur  Negative Negative   Benzodiazepines Screen, Urine Negative Negative   Cannabinoids Screen, Ur Positive (A) Negative   Cocaine(Metab.)Screen, Urine Negative Negative   Methadone Screen, Urine Negative Negative   Opiates Screen, Ur Negative Negative   Oxycodone  Screen, Ur Negative Negative  Acetaminophen  level  Result Value Ref Range   Acetaminophen  Level <2.0 (L) 10.0 - 20.0 ug/ml  Salicylate level  Result Value Ref Range   Salicylate Lvl <2.8 <=30.0 mg/dL  TSH  Result Value Ref Range   TSH 2.984 0.550 - 4.780 uIU/mL  T4, Free  Result Value Ref Range   Free T4 1.02 0.89 - 1.76 ng/dL  Hemoglobin J8r  Result Value Ref Range   Hemoglobin A1C 5.3 4.8 - 5.6 %   Estimated Average Glucose 105 mg/dL  CBC w/ Differential  Result Value Ref Range   WBC 9.0 4.0 - 10.5 10*9/L   RBC 4.83 4.10 - 5.60 10*12/L   HGB 14.3 12.5 - 17.0 g/dL   HCT 56.7 63.9 - 49.9 %   MCV 89.4 80.0 - 98.0 fL   MCH 29.6 27.0 - 34.0 pg   MCHC 33.1 32.0 - 36.0 g/dL   RDW 87.6 88.4 - 85.4 %   MPV 9.3 7.4 - 10.4 fL   Platelet 266 140 - 415 10*9/L   Neutrophils % 47.5 %   Lymphocytes % 40.1 %   Monocytes % 7.4 %   Eosinophils % 4.1 %   Basophils % 0.6 %   Absolute Neutrophils 4.3 1.8 - 7.8 10*9/L   Absolute Lymphocytes 3.6 0.7 - 4.5 10*9/L   Absolute Monocytes 0.7 0.1 - 1.0 10*9/L   Absolute Eosinophils 0.4 0.0 - 0.4 10*9/L   Absolute Basophils 0.1 0.0 - 0.2 10*9/L   No image results found.   Condition at Discharge: stable  Advanced Directives: Does patient have an advance directive covering medical treatment?: Patient does not have advance directive covering medical treatment. Reason patient does not have an advance directive covering medical treatment:: Patient does not wish to complete one at this time.  Healthcare Decision Maker: Patient does not wish to appoint a Health Care Decision Maker at this time Extended Emergency Contact Information Primary Emergency Contact: Carter,Thomas Home Phone:  [phone] Relation: Grandparent Interpreter needed? No Information offered on HCDM, Medical & Mental Health advance directives:: Patient declined information.  Does patient have an advance directive covering mental health treatment?: Patient does not have advance directive covering mental health treatment. Reason patient does not have an advance directive covering mental health treatment:: Patient does not wish to complete one at this time.   Camellia ONEIDA Lek, MD

## 2023-03-22 NOTE — Care Plan (Signed)
 Transition of Care Encounter Data   Call attempt: 2 Admission date: 03/09/23 Discharge date: 03/11/23 Discharge diagnosis: Major depressive disorder Patient post discharge: Medications:      SABRA   [facility]: [phone]:  .  [facility]: [phone]:  .  Other: Contact PCP:        [facility] TRANSITIONAL CASE MANAGEMENT SUMMARY NOTE   Attempted to contact patient today at Home to complete Transitional Case Management call from [facility]. No answer/unable to leave message and Letter sent to last known address; 1st attempt.             Delon CHRISTELLA Rea, RN

## 2024-07-10 ENCOUNTER — Other Ambulatory Visit: Payer: Self-pay

## 2024-07-10 ENCOUNTER — Emergency Department (HOSPITAL_COMMUNITY)

## 2024-07-10 ENCOUNTER — Emergency Department (HOSPITAL_COMMUNITY): Admission: EM | Admit: 2024-07-10 | Discharge: 2024-07-11 | Disposition: A | Discharge status: Home / Self Care

## 2024-07-10 ENCOUNTER — Encounter (HOSPITAL_COMMUNITY): Payer: Self-pay

## 2024-07-10 DIAGNOSIS — R944 Abnormal results of kidney function studies: Secondary | ICD-10-CM | POA: Insufficient documentation

## 2024-07-10 DIAGNOSIS — R451 Restlessness and agitation: Secondary | ICD-10-CM | POA: Insufficient documentation

## 2024-07-10 DIAGNOSIS — S299XXA Unspecified injury of thorax, initial encounter: Secondary | ICD-10-CM | POA: Diagnosis present

## 2024-07-10 DIAGNOSIS — S2231XA Fracture of one rib, right side, initial encounter for closed fracture: Secondary | ICD-10-CM | POA: Diagnosis not present

## 2024-07-10 DIAGNOSIS — Y92410 Unspecified street and highway as the place of occurrence of the external cause: Secondary | ICD-10-CM | POA: Insufficient documentation

## 2024-07-10 DIAGNOSIS — S0181XA Laceration without foreign body of other part of head, initial encounter: Secondary | ICD-10-CM | POA: Insufficient documentation

## 2024-07-10 LAB — COMPREHENSIVE METABOLIC PANEL WITH GFR
ALT: 17 U/L (ref 0–44)
AST: 25 U/L (ref 15–41)
Albumin: 4.5 g/dL (ref 3.5–5.0)
Alkaline Phosphatase: 69 U/L (ref 38–126)
Anion gap: 20 — ABNORMAL HIGH (ref 5–15)
BUN: 8 mg/dL (ref 6–20)
CO2: 15 mmol/L — ABNORMAL LOW (ref 22–32)
Calcium: 9.1 mg/dL (ref 8.9–10.3)
Chloride: 102 mmol/L (ref 98–111)
Creatinine, Ser: 1.12 mg/dL (ref 0.61–1.24)
GFR, Estimated: >60 mL/min (ref >60)
Glucose, Bld: 130 mg/dL — ABNORMAL HIGH (ref 70–99)
Potassium: 3.2 mmol/L — ABNORMAL LOW (ref 3.5–5.1)
Sodium: 137 mmol/L (ref 135–145)
Total Bilirubin: 0.7 mg/dL (ref 0.0–1.2)
Total Protein: 7.6 g/dL (ref 6.5–8.1)

## 2024-07-10 LAB — I-STAT CHEM 8, ED
BUN: 8 mg/dL (ref 6–20)
Calcium, Ion: 0.98 mmol/L — ABNORMAL LOW (ref 1.15–1.40)
Chloride: 104 mmol/L (ref 98–111)
Creatinine, Ser: 1.4 mg/dL — ABNORMAL HIGH (ref 0.61–1.24)
Glucose, Bld: 127 mg/dL — ABNORMAL HIGH (ref 70–99)
HCT: 49 % (ref 39.0–52.0)
Hemoglobin: 16.7 g/dL (ref 13.0–17.0)
Potassium: 2.9 mmol/L — ABNORMAL LOW (ref 3.5–5.1)
Sodium: 137 mmol/L (ref 135–145)
TCO2: 16 mmol/L — ABNORMAL LOW (ref 22–32)

## 2024-07-10 LAB — CBC
HCT: 48.3 % (ref 39.0–52.0)
Hemoglobin: 16.5 g/dL (ref 13.0–17.0)
MCH: 30.1 pg (ref 26.0–34.0)
MCHC: 34.2 g/dL (ref 30.0–36.0)
MCV: 88 fL (ref 80.0–100.0)
Platelets: 328 K/uL (ref 150–400)
RBC: 5.49 MIL/uL (ref 4.22–5.81)
RDW: 12.7 % (ref 11.5–15.5)
WBC: 14.2 K/uL — ABNORMAL HIGH (ref 4.0–10.5)
nRBC: 0 % (ref 0.0–0.2)

## 2024-07-10 LAB — SAMPLE TO BLOOD BANK

## 2024-07-10 LAB — I-STAT CG4 LACTIC ACID, ED: Lactic Acid, Venous: 5.1 mmol/L (ref 0.5–1.9)

## 2024-07-10 LAB — PROTIME-INR
INR: 1.1 (ref 0.8–1.2)
Prothrombin Time: 14.5 s (ref 11.4–15.2)

## 2024-07-10 LAB — ETHANOL: Alcohol, Ethyl (B): 309 mg/dL (ref <15)

## 2024-07-10 MED ORDER — IOHEXOL 350 MG/ML SOLN
75.0000 mL | Freq: Once | INTRAVENOUS | Status: AC | PRN
  Administered 2024-07-10: 75 mL via INTRAVENOUS

## 2024-07-10 MED ORDER — ONDANSETRON HCL 4 MG/2ML IJ SOLN
4.0000 mg | Freq: Once | INTRAMUSCULAR | Status: AC
  Administered 2024-07-10: 4 mg via INTRAVENOUS
  Filled 2024-07-10: qty 2

## 2024-07-10 MED ORDER — LORAZEPAM 2 MG/ML IJ SOLN
1.0000 mg | Freq: Once | INTRAMUSCULAR | Status: AC
  Administered 2024-07-10: 1 mg via INTRAVENOUS
  Filled 2024-07-10: qty 1

## 2024-07-10 MED ORDER — LACTATED RINGERS IV BOLUS
1000.0000 mL | Freq: Once | INTRAVENOUS | Status: AC
  Administered 2024-07-10: 1000 mL via INTRAVENOUS

## 2024-07-10 MED ORDER — POTASSIUM CHLORIDE 10 MEQ/100ML IV SOLN
10.0000 meq | Freq: Once | INTRAVENOUS | Status: AC
  Administered 2024-07-10: 10 meq via INTRAVENOUS
  Filled 2024-07-10: qty 100

## 2024-07-10 MED ORDER — HALOPERIDOL LACTATE 5 MG/ML IJ SOLN
5.0000 mg | Freq: Once | INTRAMUSCULAR | Status: AC
  Administered 2024-07-10: 5 mg via INTRAVENOUS
  Filled 2024-07-10: qty 1

## 2024-07-10 MED ORDER — LIDOCAINE HCL (PF) 1 % IJ SOLN
10.0000 mL | Freq: Once | INTRAMUSCULAR | Status: AC
  Administered 2024-07-11: 10 mL
  Filled 2024-07-10: qty 10

## 2024-07-10 MED ORDER — TETANUS-DIPHTH-ACELL PERTUSSIS 5-2-15.5 LF-MCG/0.5 IM SUSP
0.5000 mL | Freq: Once | INTRAMUSCULAR | Status: AC
  Administered 2024-07-10: 0.5 mL via INTRAMUSCULAR
  Filled 2024-07-10: qty 0.5

## 2024-07-10 MED ORDER — MIDAZOLAM HCL (PF) 2 MG/2ML IJ SOLN
2.0000 mg | Freq: Once | INTRAMUSCULAR | Status: AC
  Administered 2024-07-10: 2 mg via INTRAVENOUS
  Filled 2024-07-10: qty 2

## 2024-07-10 NOTE — ED Notes (Signed)
 Pt continues to yell racial slurs to staff members. Pt combative at this point.No changes to patient despite multiple attempts at redirection and verbal de-escalation. Security called to bedside.
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
## 2024-07-10 NOTE — ED Notes (Signed)
 Patient continues to yell and says f*ck you motherf*ckers repeatedly. Patient repeatedly takes off monitoring equipment and attempts to jump out the bed. EDP aware of situation. Security remains at bedside.

## 2024-07-10 NOTE — ED Notes (Signed)
 Patient continues to yell f*ck you, you piece of sh*t repeatedly. Pt continues to yell vulgar language despite multiple attempts at verbal de-escalation. Pt continues to have monitoring equipment. Patient refuses to keep arm start to allow rest of IV medication to continue. EDP notified.

## 2024-07-10 NOTE — ED Notes (Signed)
 Patient transported to CT
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
## 2024-07-11 LAB — URINALYSIS, ROUTINE W REFLEX MICROSCOPIC
Bilirubin Urine: NEGATIVE
Glucose, UA: NEGATIVE mg/dL
Hgb urine dipstick: NEGATIVE
Ketones, ur: NEGATIVE mg/dL
Leukocytes,Ua: NEGATIVE
Nitrite: NEGATIVE
Protein, ur: NEGATIVE mg/dL
Specific Gravity, Urine: 1.005 (ref 1.005–1.030)
pH: 6 (ref 5.0–8.0)

## 2024-07-11 MED ORDER — LACTATED RINGERS IV BOLUS
1000.0000 mL | Freq: Once | INTRAVENOUS | Status: AC
  Administered 2024-07-11: 1000 mL via INTRAVENOUS
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
## 2024-07-11 NOTE — ED Notes (Signed)
 During attempt to get patient onto CT table, patient becomes combative and restless. EDP notified.
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
## 2024-07-11 NOTE — ED Notes (Signed)
 Pt given scrubs and room phone to call ride.
